# Patient Record
Sex: Male | Born: 1965 | Race: Black or African American | Hispanic: No | Marital: Single | Smoking: Current every day smoker
Health system: Southern US, Community
[De-identification: ages and names within clinical notes are randomized; demographics above are authoritative.]

## PROBLEM LIST (undated history)

## (undated) DIAGNOSIS — F1010 Alcohol abuse, uncomplicated: Secondary | ICD-10-CM

## (undated) DIAGNOSIS — G4733 Obstructive sleep apnea (adult) (pediatric): Secondary | ICD-10-CM

## (undated) DIAGNOSIS — W3400XA Accidental discharge from unspecified firearms or gun, initial encounter: Secondary | ICD-10-CM

## (undated) DIAGNOSIS — F1410 Cocaine abuse, uncomplicated: Secondary | ICD-10-CM

## (undated) HISTORY — PX: SHOULDER SURGERY: SHX246

---

## 2014-09-11 ENCOUNTER — Emergency Department: Payer: Self-pay

## 2014-09-11 ENCOUNTER — Encounter: Payer: Self-pay | Admitting: *Deleted

## 2014-09-11 ENCOUNTER — Emergency Department
Admission: EM | Admit: 2014-09-11 | Discharge: 2014-09-11 | Disposition: A | Discharge status: Home / Self Care | Payer: Self-pay | Attending: Emergency Medicine | Admitting: Emergency Medicine

## 2014-09-11 DIAGNOSIS — Y998 Other external cause status: Secondary | ICD-10-CM | POA: Insufficient documentation

## 2014-09-11 DIAGNOSIS — Y9389 Activity, other specified: Secondary | ICD-10-CM | POA: Insufficient documentation

## 2014-09-11 DIAGNOSIS — Y92008 Other place in unspecified non-institutional (private) residence as the place of occurrence of the external cause: Secondary | ICD-10-CM | POA: Insufficient documentation

## 2014-09-11 DIAGNOSIS — S42322A Displaced transverse fracture of shaft of humerus, left arm, initial encounter for closed fracture: Secondary | ICD-10-CM | POA: Insufficient documentation

## 2014-09-11 DIAGNOSIS — W1839XA Other fall on same level, initial encounter: Secondary | ICD-10-CM | POA: Insufficient documentation

## 2014-09-11 DIAGNOSIS — S42302A Unspecified fracture of shaft of humerus, left arm, initial encounter for closed fracture: Secondary | ICD-10-CM

## 2014-09-11 DIAGNOSIS — Z720 Tobacco use: Secondary | ICD-10-CM | POA: Insufficient documentation

## 2014-09-11 HISTORY — DX: Obstructive sleep apnea (adult) (pediatric): G47.33

## 2014-09-11 HISTORY — DX: Accidental discharge from unspecified firearms or gun, initial encounter: W34.00XA

## 2014-09-11 MED ORDER — OXYCODONE-ACETAMINOPHEN 5-325 MG PO TABS: 1.0000 | ORAL_TABLET | Freq: Four times a day (QID) | ORAL | Status: AC | PRN

## 2014-09-11 MED ORDER — HYDROMORPHONE HCL 1 MG/ML IJ SOLN
1.0000 mg | Freq: Once | INTRAMUSCULAR | Status: AC
  Administered 2014-09-11: 1 mg via INTRAVENOUS
  Filled 2014-09-11: qty 1

## 2014-09-11 NOTE — ED Notes (Signed)
Pt fell from a porch approximately 3 ft from ground. Pt presents w/ arm supported, states was deformed. Pt presents w/ swelling to upper L arm, decreased movement of upper arm.

## 2014-09-11 NOTE — Discharge Instructions (Signed)
Humerus Fracture, Treated with Immobilization  The humerus is the large bone in your upper arm. You have a broken (fractured) humerus. These fractures are easily diagnosed with X-rays.  TREATMENT   Simple fractures which will heal without disability are treated with simple immobilization. Immobilization means you will wear a cast, splint, or sling. You have a fracture which will do well with immobilization. The fracture will heal well simply by being held in a good position until it is stable enough to begin range of motion exercises. Do not take part in activities which would further injure your arm.   HOME CARE INSTRUCTIONS    Put ice on the injured area.   Put ice in a plastic bag.   Place a towel between your skin and the bag.   Leave the ice on for 15-20 minutes, 03-04 times a day.   If you have a cast:   Do not scratch the skin under the cast using sharp or pointed objects.   Check the skin around the cast every day. You may put lotion on any red or sore areas.   Keep your cast dry and clean.   If you have a splint:   Wear the splint as directed.   Keep your splint dry and clean.   You may loosen the elastic around the splint if your fingers become numb, tingle, or turn cold or blue.   If you have a sling:   Wear the sling as directed.   Do not put pressure on any part of your cast or splint until it is fully hardened.   Your cast or splint can be protected during bathing with a plastic bag. Do not lower the cast or splint into water.   Only take over-the-counter or prescription medicines for pain, discomfort, or fever as directed by your caregiver.   Do range of motion exercises as instructed by your caregiver.   Follow up as directed by your caregiver. This is very important in order to avoid permanent injury or disability and chronic pain.  SEEK IMMEDIATE MEDICAL CARE IF:    Your skin or nails in the injured arm turn blue or gray.   Your arm feels cold or numb.   You develop severe  pain in the injured arm.   You are having problems with the medicines you were given.  MAKE SURE YOU:    Understand these instructions.   Will watch your condition.   Will get help right away if you are not doing well or get worse.  Document Released: 05/06/2000 Document Revised: 04/22/2011 Document Reviewed: 03/14/2010  [employer] Patient Information 2015 [employer]. This information is not intended to replace advice given to you by your health care provider. Make sure you discuss any questions you have with your health care provider.

## 2014-09-11 NOTE — ED Notes (Signed)
Patient fell off of a porch today and landed on left arm on some large rocks. Extreme pain to left arm.
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
## 2015-04-12 ENCOUNTER — Emergency Department
Admission: EM | Admit: 2015-04-12 | Discharge: 2015-04-12 | Disposition: A | Discharge status: Home / Self Care | Payer: Self-pay | Attending: Emergency Medicine | Admitting: Emergency Medicine

## 2015-04-12 DIAGNOSIS — Z046 Encounter for general psychiatric examination, requested by authority: Secondary | ICD-10-CM | POA: Insufficient documentation

## 2015-04-12 DIAGNOSIS — F10120 Alcohol abuse with intoxication, uncomplicated: Secondary | ICD-10-CM | POA: Insufficient documentation

## 2015-04-12 DIAGNOSIS — F17210 Nicotine dependence, cigarettes, uncomplicated: Secondary | ICD-10-CM | POA: Insufficient documentation

## 2015-04-12 DIAGNOSIS — Z008 Encounter for other general examination: Secondary | ICD-10-CM

## 2015-04-12 DIAGNOSIS — F10920 Alcohol use, unspecified with intoxication, uncomplicated: Secondary | ICD-10-CM

## 2015-04-12 DIAGNOSIS — F1410 Cocaine abuse, uncomplicated: Secondary | ICD-10-CM | POA: Insufficient documentation

## 2015-04-12 LAB — COMPREHENSIVE METABOLIC PANEL
ALBUMIN: 3.8 g/dL (ref 3.5–5.0)
ALT: 20 U/L (ref 17–63)
ANION GAP: 6 (ref 5–15)
AST: 27 U/L (ref 15–41)
Alkaline Phosphatase: 83 U/L (ref 38–126)
BUN: 13 mg/dL (ref 6–20)
CALCIUM: 9.5 mg/dL (ref 8.9–10.3)
CO2: 26 mmol/L (ref 22–32)
Chloride: 107 mmol/L (ref 101–111)
Creatinine, Ser: 1.51 mg/dL — ABNORMAL HIGH (ref 0.61–1.24)
GFR, EST NON AFRICAN AMERICAN: 53 mL/min — AB (ref >60)
Glucose, Bld: 108 mg/dL — ABNORMAL HIGH (ref 65–99)
Potassium: 4 mmol/L (ref 3.5–5.1)
Sodium: 139 mmol/L (ref 135–145)
Total Bilirubin: 0.5 mg/dL (ref 0.3–1.2)
Total Protein: 6.9 g/dL (ref 6.5–8.1)

## 2015-04-12 LAB — CBC WITH DIFFERENTIAL/PLATELET
BASOS PCT: 1 %
Basophils Absolute: 0.1 10*3/uL (ref 0–0.1)
Eosinophils Absolute: 0.2 10*3/uL (ref 0–0.7)
Eosinophils Relative: 3 %
HCT: 38.2 % — ABNORMAL LOW (ref 40.0–52.0)
Hemoglobin: 12.5 g/dL — ABNORMAL LOW (ref 13.0–18.0)
LYMPHS PCT: 22 %
Lymphs Abs: 1.7 10*3/uL (ref 1.0–3.6)
MCH: 27.6 pg (ref 26.0–34.0)
MCHC: 32.8 g/dL (ref 32.0–36.0)
MCV: 84 fL (ref 80.0–100.0)
Monocytes Absolute: 0.7 10*3/uL (ref 0.2–1.0)
Monocytes Relative: 9 %
NEUTROS ABS: 5.1 10*3/uL (ref 1.4–6.5)
Neutrophils Relative %: 65 %
PLATELETS: 278 10*3/uL (ref 150–440)
RBC: 4.54 MIL/uL (ref 4.40–5.90)
RDW: 15.2 % — ABNORMAL HIGH (ref 11.5–14.5)
WBC: 7.8 10*3/uL (ref 3.8–10.6)

## 2015-04-12 LAB — ETHANOL
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
## 2015-04-12 NOTE — ED Notes (Signed)
Pt here for clearance for jail, has been drinking, cocaine and marijuana.

## 2015-04-12 NOTE — Discharge Instructions (Signed)
Alcohol Intoxication Alcohol intoxication occurs when you drink enough alcohol that it affects your ability to function. It can be mild or very severe. Drinking a lot of alcohol in a short time is called binge drinking. This can be very harmful. Drinking alcohol can also be more dangerous if you are taking medicines or other drugs. Some of the effects caused by alcohol may include:  Loss of coordination.  Changes in mood and behavior.  Unclear thinking.  Trouble talking (slurred speech).  Throwing up (vomiting).  Confusion.  Slowed breathing.  Twitching and shaking (seizures).  Loss of consciousness. HOME CARE  Do not drive after drinking alcohol.  Drink enough water and fluids to keep your pee (urine) clear or pale yellow. Avoid caffeine.  Only take medicine as told by your doctor. GET HELP IF:  You throw up (vomit) many times.  You do not feel better after a few days.  You frequently have alcohol intoxication. Your doctor can help decide if you should see a substance use treatment counselor. GET HELP RIGHT AWAY IF:  You become shaky when you stop drinking.  You have twitching and shaking.  You throw up blood. It may look bright red or like coffee grounds.  You notice blood in your poop (bowel movements).  You become lightheaded or pass out (faint). MAKE SURE YOU:   Understand these instructions.  Will watch your condition.  Will get help right away if you are not doing well or get worse.   This information is not intended to replace advice given to you by your health care provider. Make sure you discuss any questions you have with your health care provider.   Document Released: 07/17/2007 Document Revised: 09/30/2012 Document Reviewed: 07/03/2012 Elsevier Interactive Patient Education 2016 [employer].  Health Maintenance, Male A healthy lifestyle and preventative care can promote health and wellness.  Maintain regular health, dental, and eye  exams.  Eat a healthy diet. Foods like vegetables, fruits, whole grains, low-fat dairy products, and lean protein foods contain the nutrients you need and are low in calories. Decrease your intake of foods high in solid fats, added sugars, and salt. Get information about a proper diet from your health care provider, if necessary.  Regular physical exercise is one of the most important things you can do for your health. Most adults should get at least 150 minutes of moderate-intensity exercise (any activity that increases your heart rate and causes you to sweat) each week. In addition, most adults need muscle-strengthening exercises on 2 or more days a week.   Maintain a healthy weight. The body mass index (BMI) is a screening tool to identify possible weight problems. It provides an estimate of body fat based on height and weight. Your health care provider can find your BMI and can help you achieve or maintain a healthy weight. For males 20 years and older:  A BMI below 18.5 is considered underweight.  A BMI of 18.5 to 24.9 is normal.  A BMI of 25 to 29.9 is considered overweight.  A BMI of 30 and above is considered obese.  Maintain normal blood lipids and cholesterol by exercising and minimizing your intake of saturated fat. Eat a balanced diet with plenty of fruits and vegetables. Blood tests for lipids and cholesterol should begin at age 68 and be repeated every 5 years. If your lipid or cholesterol levels are high, you are over age 58, or you are at high risk for heart disease, you may need your  cholesterol levels checked more frequently.Ongoing high lipid and cholesterol levels should be treated with medicines if diet and exercise are not working.  If you smoke, find out from your health care provider how to quit. If you do not use tobacco, do not start.  Lung cancer screening is recommended for adults aged 55-80 years who are at high risk for developing lung cancer because of a history  of smoking. A yearly low-dose CT scan of the lungs is recommended for people who have at least a 30-pack-year history of smoking and are current smokers or have quit within the past 15 years. A pack year of smoking is smoking an average of 1 pack of cigarettes a day for 1 year (for example, a 30-pack-year history of smoking could mean smoking 1 pack a day for 30 years or 2 packs a day for 15 years). Yearly screening should continue until the smoker has stopped smoking for at least 15 years. Yearly screening should be stopped for people who develop a health problem that would prevent them from having lung cancer treatment.  If you choose to drink alcohol, do not have more than 2 drinks per day. One drink is considered to be 12 oz (360 mL) of beer, 5 oz (150 mL) of wine, or 1.5 oz (45 mL) of liquor.  Avoid the use of street drugs. Do not share needles with anyone. Ask for help if you need support or instructions about stopping the use of drugs.  High blood pressure causes heart disease and increases the risk of stroke. High blood pressure is more likely to develop in:  People who have blood pressure in the end of the normal range (100-139/85-89 mm Hg).  People who are overweight or obese.  People who are African American.  If you are 61-45 years of age, have your blood pressure checked every 3-5 years. If you are 91 years of age or older, have your blood pressure checked every year. You should have your blood pressure measured twice--once when you are at a hospital or clinic, and once when you are not at a hospital or clinic. Record the average of the two measurements. To check your blood pressure when you are not at a hospital or clinic, you can use:  An automated blood pressure machine at a pharmacy.  A home blood pressure monitor.  If you are 9-29 years old, ask your health care provider if you should take aspirin to prevent heart disease.  Diabetes screening involves taking a blood sample to  check your fasting blood sugar level. This should be done once every 3 years after age 40 if you are at a normal weight and without risk factors for diabetes. Testing should be considered at a younger age or be carried out more frequently if you are overweight and have at least 1 risk factor for diabetes.  Colorectal cancer can be detected and often prevented. Most routine colorectal cancer screening begins at the age of 50 and continues through age 3. However, your health care provider may recommend screening at an earlier age if you have risk factors for colon cancer. On a yearly basis, your health care provider may provide home test kits to check for hidden blood in the stool. A small camera at the end of a tube may be used to directly examine the colon (sigmoidoscopy or colonoscopy) to detect the earliest forms of colorectal cancer. Talk to your health care provider about this at age 48 when routine screening begins.  A direct exam of the colon should be repeated every 5-10 years through age 76, unless early forms of precancerous polyps or small growths are found.  People who are at an increased risk for hepatitis B should be screened for this virus. You are considered at high risk for hepatitis B if:  You were born in a country where hepatitis B occurs often. Talk with your health care provider about which countries are considered high risk.  Your parents were born in a high-risk country and you have not received a shot to protect against hepatitis B (hepatitis B vaccine).  You have HIV or AIDS.  You use needles to inject street drugs.  You live with, or have sex with, someone who has hepatitis B.  You are a man who has sex with other men (MSM).  You get hemodialysis treatment.  You take certain medicines for conditions like cancer, organ transplantation, and autoimmune conditions.  Hepatitis C blood testing is recommended for all people born from [birth year] through 1963 and any individual with  known risk factors for hepatitis C.  Healthy men should no longer receive prostate-specific antigen (PSA) blood tests as part of routine cancer screening. Talk to your health care provider about prostate cancer screening.  Testicular cancer screening is not recommended for adolescents or adult males who have no symptoms. Screening includes self-exam, a health care provider exam, and other screening tests. Consult with your health care provider about any symptoms you have or any concerns you have about testicular cancer.  Practice safe sex. Use condoms and avoid high-risk sexual practices to reduce the spread of sexually transmitted infections (STIs).  You should be screened for STIs, including gonorrhea and chlamydia if:  You are sexually active and are younger than 24 years.  You are older than 24 years, and your health care provider tells you that you are at risk for this type of infection.  Your sexual activity has changed since you were last screened, and you are at an increased risk for chlamydia or gonorrhea. Ask your health care provider if you are at risk.  If you are at risk of being infected with HIV, it is recommended that you take a prescription medicine daily to prevent HIV infection. This is called pre-exposure prophylaxis (PrEP). You are considered at risk if:  You are a man who has sex with other men (MSM).  You are a heterosexual man who is sexually active with multiple partners.  You take drugs by injection.  You are sexually active with a partner who has HIV.  Talk with your health care provider about whether you are at high risk of being infected with HIV. If you choose to begin PrEP, you should first be tested for HIV. You should then be tested every 3 months for as long as you are taking PrEP.  Use sunscreen. Apply sunscreen liberally and repeatedly throughout the day. You should seek shade when your shadow is shorter than you. Protect yourself by wearing long  sleeves, pants, a wide-brimmed hat, and sunglasses year round whenever you are outdoors.  Tell your health care provider of new moles or changes in moles, especially if there is a change in shape or color. Also, tell your health care provider if a mole is larger than the size of a pencil eraser.  A one-time screening for abdominal aortic aneurysm (AAA) and surgical repair of large AAAs by ultrasound is recommended for men aged 65-75 years who are current or former smokers.  Stay current with your vaccines (immunizations).   This information is not intended to replace advice given to you by your health care provider. Make sure you discuss any questions you have with your health care provider.   Document Released: 07/27/2007 Document Revised: 02/18/2014 Document Reviewed: 06/25/2010 Elsevier Interactive Patient Education 2016 [employer].  Medical Screening Exam A medical screening exam has been done. This exam helps find the cause of your problem and determines whether you need emergency treatment. Your exam has shown that you do not need emergency treatment at this point. It is safe for you to go to your caregiver's office or clinic for treatment. You should make an appointment today to see your caregiver as soon as he or she is available. Depending on your illness, your symptoms and condition can change over time. If your condition gets worse or you develop new or troubling symptoms before you see your caregiver, you should return to the emergency department for further evaluation.    This information is not intended to replace advice given to you by your health care provider. Make sure you discuss any questions you have with your health care provider.   Document Released: 03/07/2004 Document Revised: 02/18/2014 Document Reviewed: 10/17/2010 Elsevier Interactive Patient Education [employer]

## 2015-04-12 NOTE — ED Notes (Signed)
Pt sleeping soundly. [employer] officer with pt.

## 2015-04-12 NOTE — ED Notes (Signed)
Discharge paperwork reviewed and given to [employer] officer.

## 2016-03-30 IMAGING — CR DG HUMERUS 2V *L*
2 series · 2 of 2 positions shown · non-contrast
Comparison: None.

CLINICAL DATA: Fall off porch 3 feet onto rocks with left arm pain.

EXAM:
LEFT HUMERUS - 2+ VIEW

[humerus ap]
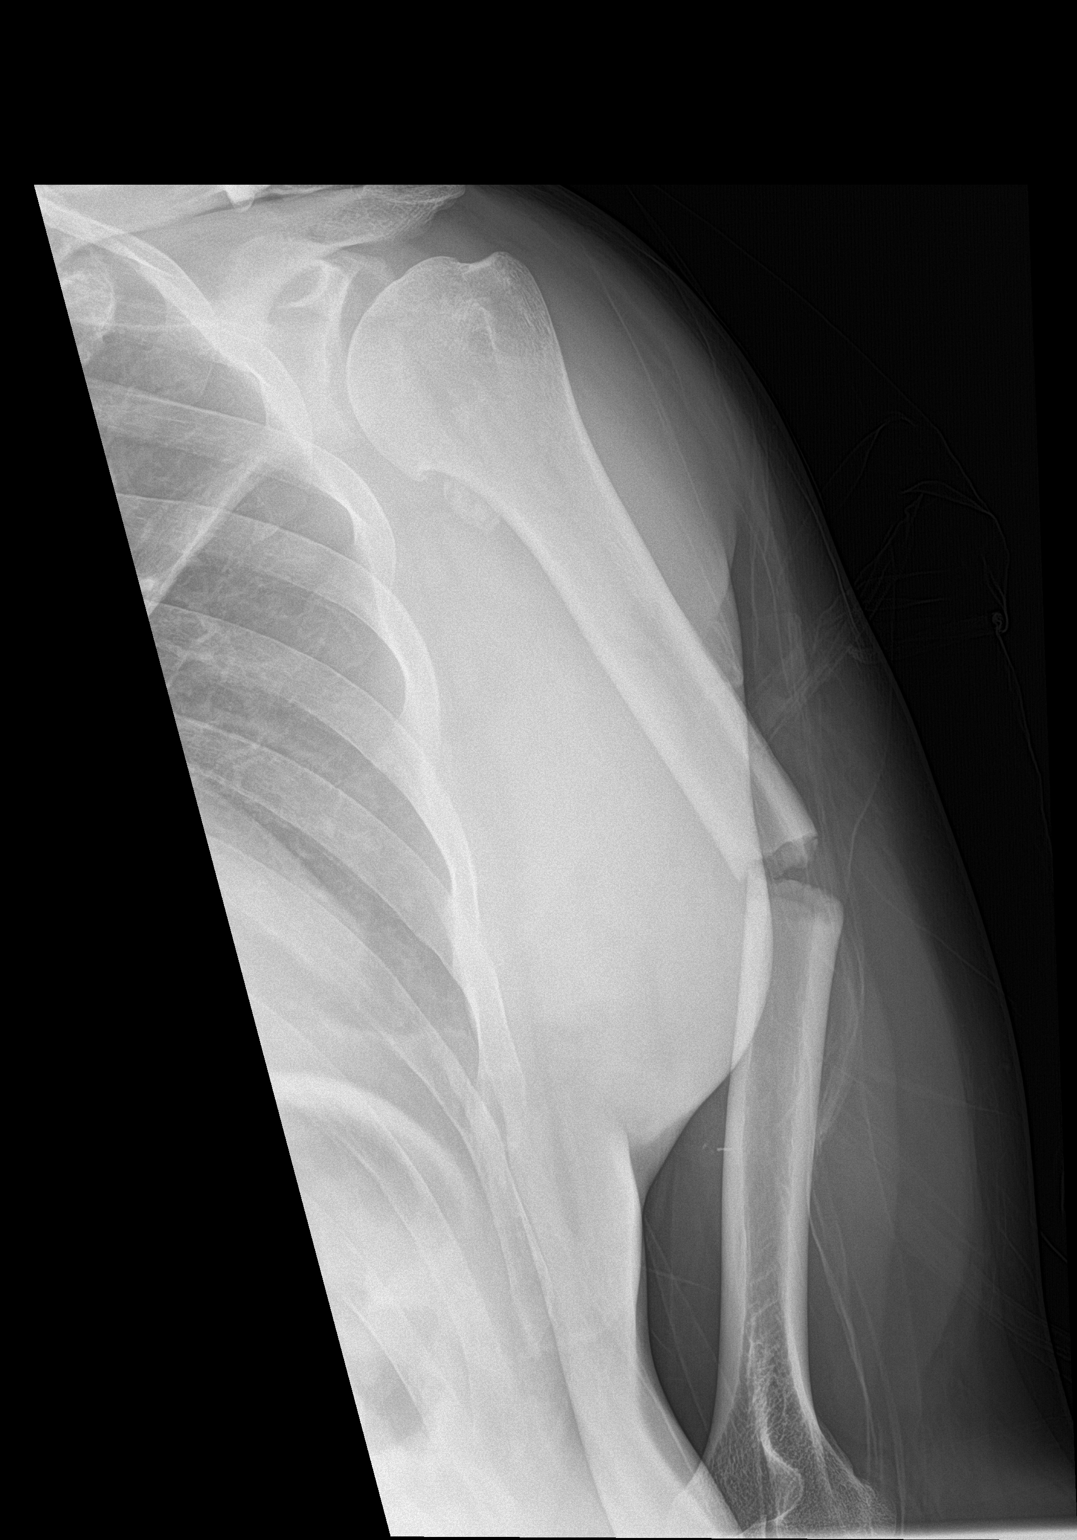

[humerus lat]
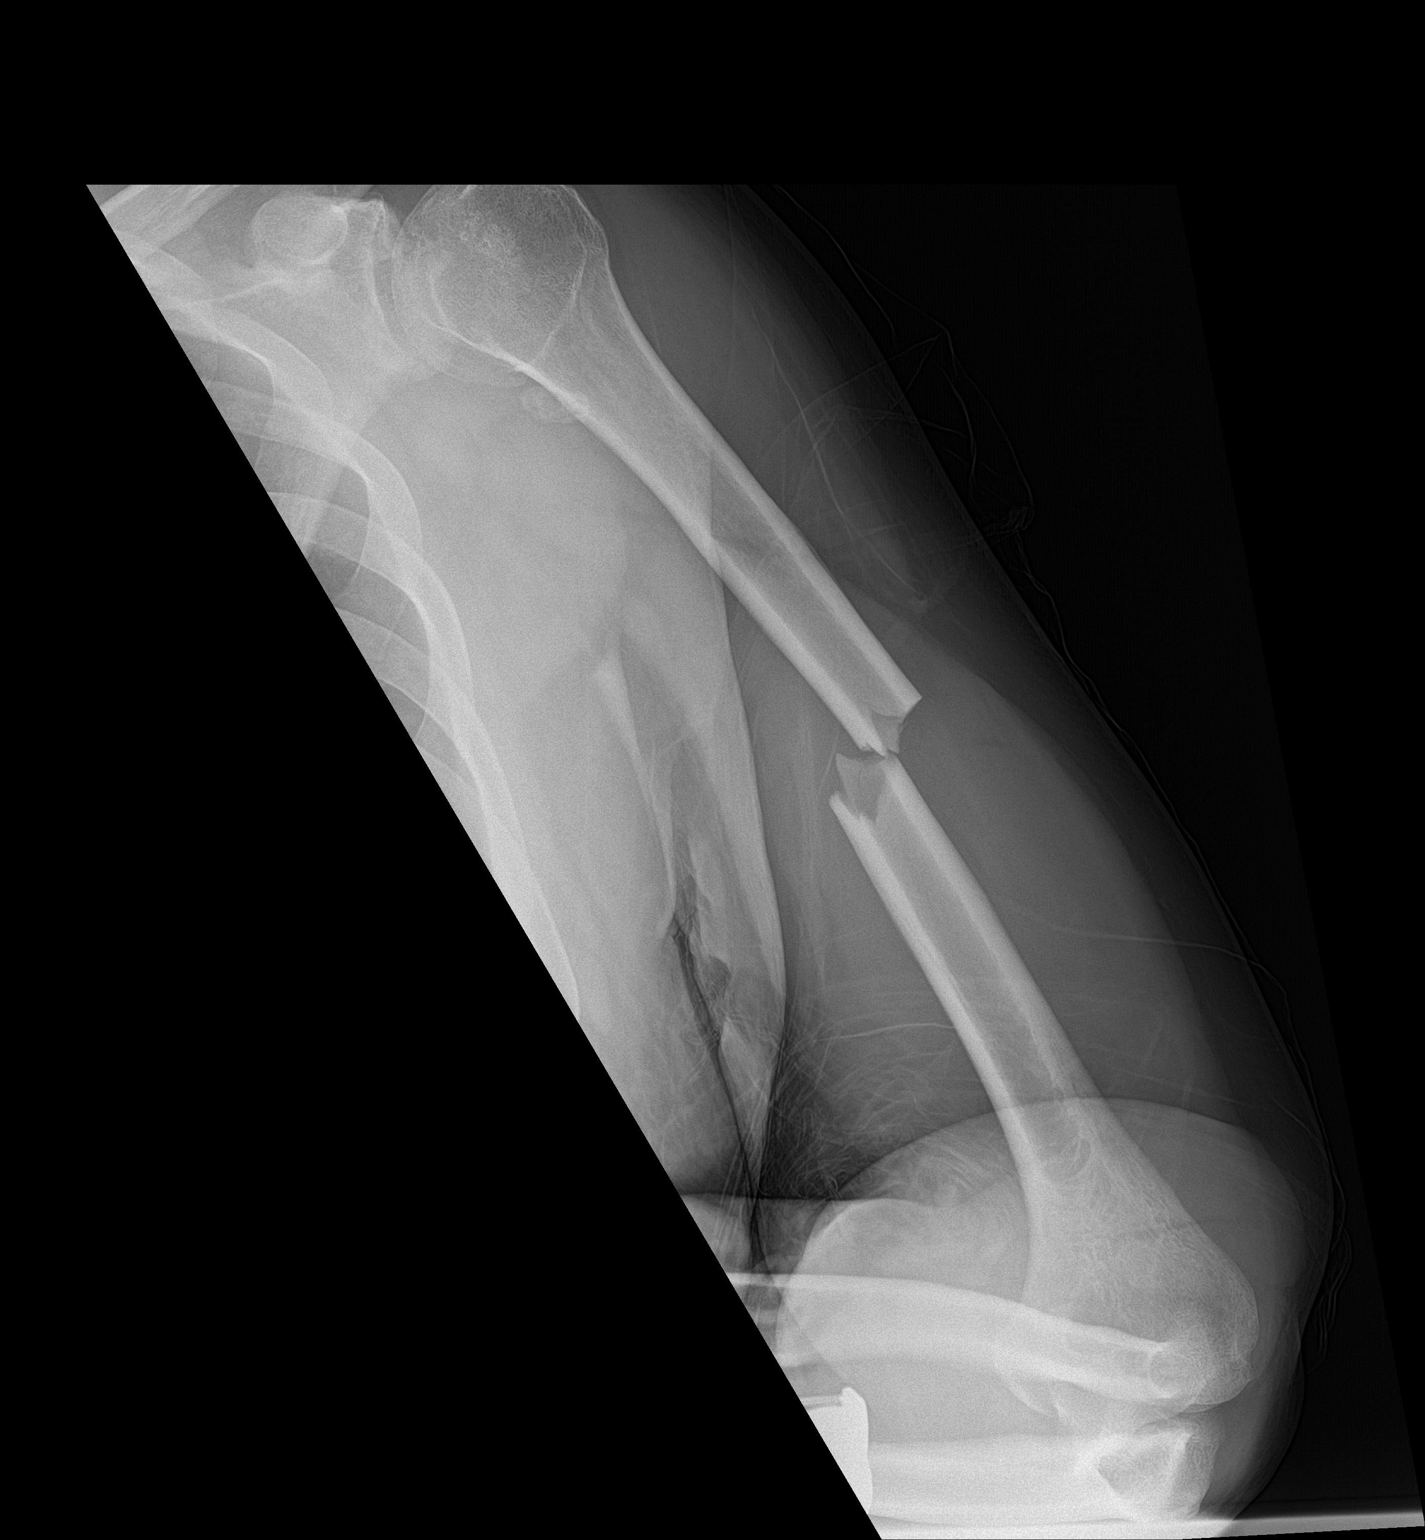

[2 of 2 positions shown; findings below may reference images not displayed]

FINDINGS: Examination demonstrates a displaced transverse fracture through the
mid humeral diaphysis. There is approximately 1 shaft's with of
anterior medial displacement of the distal fragment. There are
degenerative changes of the glenohumeral joint. 1.5 cm well
corticated ossific density adjacent the proximal humeral metaphysis
which may be intra-articular loose body.
IMPRESSION: Displaced left humeral mid diaphyseal fracture.

## 2017-03-31 ENCOUNTER — Other Ambulatory Visit
Admission: AD | Admit: 2017-03-31 | Discharge: 2017-03-31 | Disposition: A | Discharge status: Home / Self Care | Attending: Family Medicine | Admitting: Family Medicine

## 2017-03-31 NOTE — ED Triage Notes (Signed)
Two grey top tubes obtained from this pt by this RN, cleaned site with betadine prior to needle puncture. Pt tolerated well. Specimens given to officer  Orson AloeHenderson with [employer]. Pt cooperative at time of blood draw.

## 2017-04-18 ENCOUNTER — Emergency Department
Admission: EM | Admit: 2017-04-18 | Discharge: 2017-04-18 | Attending: Emergency Medicine | Admitting: Emergency Medicine

## 2017-04-18 ENCOUNTER — Emergency Department

## 2017-04-18 ENCOUNTER — Other Ambulatory Visit: Payer: Self-pay

## 2017-04-18 ENCOUNTER — Encounter: Payer: Self-pay | Admitting: Emergency Medicine

## 2017-04-18 DIAGNOSIS — F1423 Cocaine dependence with withdrawal: Secondary | ICD-10-CM | POA: Insufficient documentation

## 2017-04-18 DIAGNOSIS — Y929 Unspecified place or not applicable: Secondary | ICD-10-CM | POA: Diagnosis not present

## 2017-04-18 DIAGNOSIS — S46912A Strain of unspecified muscle, fascia and tendon at shoulder and upper arm level, left arm, initial encounter: Secondary | ICD-10-CM | POA: Insufficient documentation

## 2017-04-18 DIAGNOSIS — F17210 Nicotine dependence, cigarettes, uncomplicated: Secondary | ICD-10-CM | POA: Diagnosis not present

## 2017-04-18 DIAGNOSIS — G8929 Other chronic pain: Secondary | ICD-10-CM

## 2017-04-18 DIAGNOSIS — M25512 Pain in left shoulder: Secondary | ICD-10-CM

## 2017-04-18 DIAGNOSIS — Y999 Unspecified external cause status: Secondary | ICD-10-CM | POA: Diagnosis not present

## 2017-04-18 DIAGNOSIS — F1010 Alcohol abuse, uncomplicated: Secondary | ICD-10-CM | POA: Insufficient documentation

## 2017-04-18 DIAGNOSIS — F1493 Cocaine use, unspecified with withdrawal: Secondary | ICD-10-CM

## 2017-04-18 DIAGNOSIS — Y939 Activity, unspecified: Secondary | ICD-10-CM | POA: Insufficient documentation

## 2017-04-18 DIAGNOSIS — S4992XA Unspecified injury of left shoulder and upper arm, initial encounter: Secondary | ICD-10-CM | POA: Diagnosis present

## 2017-04-18 DIAGNOSIS — T148XXA Other injury of unspecified body region, initial encounter: Secondary | ICD-10-CM

## 2017-04-18 HISTORY — DX: Alcohol abuse, uncomplicated: F10.10

## 2017-04-18 HISTORY — DX: Cocaine abuse, uncomplicated: F14.10

## 2017-04-18 LAB — ETHANOL: Alcohol, Ethyl (B): <10 mg/dL (ref <10)

## 2017-04-18 MED ORDER — IBUPROFEN 800 MG PO TABS
800.0000 mg | ORAL_TABLET | Freq: Once | ORAL | Status: AC
  Administered 2017-04-18: 800 mg via ORAL
  Filled 2017-04-18: qty 1

## 2017-04-18 NOTE — ED Notes (Signed)
X-ray at bedside
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
## 2017-04-18 NOTE — Discharge Instructions (Signed)
YOU ARE MEDICALLY STABLE FOR BOOKING  Please use ibuprofen and tylenol as needed for shoulder pain and follow up with your PMD as needed.   Dg Shoulder Left  Result Date: 04/18/2017 CLINICAL DATA:  Left shoulder pain EXAM: LEFT SHOULDER - 2+ VIEW COMPARISON:  09/11/2014 FINDINGS: Left lung apex is clear. AC joint within normal limits. Interval surgical plate and screw fixation of the shaft of the humerus across old fracture deformity of the mid humerus. Hardware appears intact. Alignment within normal limits. Coarse oval calcification inferior to the left humeral head, possible loose body. IMPRESSION: 1. No acute osseous abnormality 2. Surgical plate and screw fixation of the left humerus across old fracture deformity of the mid left humerus 3. Possible loose body inferior to the humeral head. This is unchanged. Electronically Signed   By: Jasmine PangKim  Fujinaga M.D.   On: 04/18/2017 19:26

## 2021-04-04 DIAGNOSIS — R001 Bradycardia, unspecified: Secondary | ICD-10-CM | POA: Diagnosis not present

## 2021-04-04 DIAGNOSIS — R0789 Other chest pain: Secondary | ICD-10-CM | POA: Diagnosis not present

## 2021-04-04 DIAGNOSIS — R079 Chest pain, unspecified: Secondary | ICD-10-CM | POA: Diagnosis not present

## 2021-04-04 DIAGNOSIS — R531 Weakness: Secondary | ICD-10-CM | POA: Diagnosis not present

## 2023-08-22 DIAGNOSIS — K529 Noninfective gastroenteritis and colitis, unspecified: Secondary | ICD-10-CM | POA: Diagnosis not present
